# Patient Record
Sex: Male | Born: 1955 | Race: White | Hispanic: No | Marital: Single | Smoking: Current every day smoker
Health system: Southern US, Community
[De-identification: ages and names within clinical notes are randomized; demographics above are authoritative.]

## PROBLEM LIST (undated history)

## (undated) DIAGNOSIS — R931 Abnormal findings on diagnostic imaging of heart and coronary circulation: Secondary | ICD-10-CM

## (undated) DIAGNOSIS — F419 Anxiety disorder, unspecified: Secondary | ICD-10-CM

## (undated) DIAGNOSIS — I219 Acute myocardial infarction, unspecified: Secondary | ICD-10-CM

## (undated) DIAGNOSIS — N189 Chronic kidney disease, unspecified: Secondary | ICD-10-CM

## (undated) DIAGNOSIS — F32A Depression, unspecified: Secondary | ICD-10-CM

## (undated) DIAGNOSIS — C801 Malignant (primary) neoplasm, unspecified: Secondary | ICD-10-CM

## (undated) DIAGNOSIS — F329 Major depressive disorder, single episode, unspecified: Secondary | ICD-10-CM

## (undated) DIAGNOSIS — D759 Disease of blood and blood-forming organs, unspecified: Secondary | ICD-10-CM

## (undated) HISTORY — PX: CARDIAC CATHETERIZATION: SHX172

## (undated) HISTORY — PX: TONSILLECTOMY: SUR1361

## (undated) HISTORY — PX: CORONARY ANGIOPLASTY WITH STENT PLACEMENT: SHX49

## (undated) HISTORY — PX: BACK SURGERY: SHX140

## (undated) HISTORY — PX: WRIST SURGERY: SHX841

---

## 1997-12-25 ENCOUNTER — Ambulatory Visit (HOSPITAL_COMMUNITY): Admission: RE | Admit: 1997-12-25 | Discharge: 1997-12-25 | Payer: Self-pay | Admitting: Urology

## 2003-01-05 ENCOUNTER — Ambulatory Visit (HOSPITAL_COMMUNITY): Admission: RE | Admit: 2003-01-05 | Discharge: 2003-01-06 | Payer: Self-pay | Admitting: Neurosurgery

## 2006-05-10 ENCOUNTER — Ambulatory Visit (HOSPITAL_COMMUNITY): Admission: RE | Admit: 2006-05-10 | Discharge: 2006-05-10 | Payer: Self-pay | Admitting: Urology

## 2010-11-04 ENCOUNTER — Other Ambulatory Visit: Payer: Self-pay | Admitting: Neurosurgery

## 2010-11-04 DIAGNOSIS — S32009A Unspecified fracture of unspecified lumbar vertebra, initial encounter for closed fracture: Secondary | ICD-10-CM

## 2010-11-08 ENCOUNTER — Ambulatory Visit
Admission: RE | Admit: 2010-11-08 | Discharge: 2010-11-08 | Disposition: A | Discharge status: Home / Self Care | Payer: Medicare Other | Source: Ambulatory Visit | Attending: Neurosurgery | Admitting: Neurosurgery

## 2010-11-08 DIAGNOSIS — S32009A Unspecified fracture of unspecified lumbar vertebra, initial encounter for closed fracture: Secondary | ICD-10-CM

## 2010-11-08 MED ORDER — GADOBENATE DIMEGLUMINE 529 MG/ML IV SOLN
20.0000 mL | Freq: Once | INTRAVENOUS | Status: AC | PRN
  Administered 2010-11-08: 20 mL via INTRAVENOUS

## 2011-01-01 ENCOUNTER — Encounter (HOSPITAL_COMMUNITY): Payer: Self-pay | Admitting: Pharmacy Technician

## 2011-01-05 ENCOUNTER — Encounter (HOSPITAL_COMMUNITY)
Admission: RE | Admit: 2011-01-05 | Discharge: 2011-01-05 | Disposition: A | Discharge status: Home / Self Care | Payer: Medicare Other | Source: Ambulatory Visit | Attending: Neurosurgery | Admitting: Neurosurgery

## 2011-01-05 ENCOUNTER — Other Ambulatory Visit: Payer: Self-pay | Admitting: Neurosurgery

## 2011-01-05 ENCOUNTER — Encounter (HOSPITAL_COMMUNITY): Payer: Self-pay

## 2011-01-05 HISTORY — DX: Acute myocardial infarction, unspecified: I21.9

## 2011-01-05 HISTORY — DX: Abnormal findings on diagnostic imaging of heart and coronary circulation: R93.1

## 2011-01-05 HISTORY — DX: Anxiety disorder, unspecified: F41.9

## 2011-01-05 HISTORY — DX: Depression, unspecified: F32.A

## 2011-01-05 HISTORY — DX: Major depressive disorder, single episode, unspecified: F32.9

## 2011-01-05 LAB — CBC
HCT: 41.1 % (ref 39.0–52.0)
Hemoglobin: 13.8 g/dL (ref 13.0–17.0)
MCH: 31.2 pg (ref 26.0–34.0)
RBC: 4.43 MIL/uL (ref 4.22–5.81)

## 2011-01-05 LAB — BASIC METABOLIC PANEL
BUN: 11 mg/dL (ref 6–23)
CO2: 26 mEq/L (ref 19–32)
Glucose, Bld: 77 mg/dL (ref 70–99)
Potassium: 3.9 mEq/L (ref 3.5–5.1)
Sodium: 139 mEq/L (ref 135–145)

## 2011-01-05 LAB — SURGICAL PCR SCREEN
MRSA, PCR: POSITIVE — AB
Staphylococcus aureus: POSITIVE — AB

## 2011-01-05 LAB — TYPE AND SCREEN
ABO/RH(D): O POS
Antibody Screen: NEGATIVE

## 2011-01-05 NOTE — Progress Notes (Signed)
WILL GIVE TO P.A. FOR CARDIAC REVIEW.

## 2011-01-05 NOTE — Pre-Procedure Instructions (Signed)
20 Gabriel Adams  01/05/2011   Your procedure is scheduled on:  Thurs,Nov 29 @ 0730 Report to [facility] at 5:30 AM.  Call this number if you have problems the morning of surgery: [phone]   Remember:   Do not eat food:After Midnight.  Do not drink clear liquids: 4 Hours before arrival.  Take these medicines the morning of surgery with A SIP OF WATER: Wellbutrin,Prozac,Pain Pill(if needed)   Do not wear jewelry, make-up or nail polish.  Do not wear lotions, powders, or perfumes. You may wear deodorant.  Do not shave 48 hours prior to surgery.  Do not bring valuables to the hospital.  Contacts, dentures or bridgework may not be worn into surgery.  Leave suitcase in the car. After surgery it may be brought to your room.  For patients admitted to the hospital, checkout time is 11:00 AM the day of discharge.   Patients discharged the day of surgery will not be allowed to drive home.  Name and phone number of your driver:   Special Instructions: CHG Shower Use Special Wash: 1/2 bottle night before surgery and 1/2 bottle morning of surgery.   Please read over the following fact sheets that you were given: Pain Booklet, Coughing and Deep Breathing, Blood Transfusion Information, MRSA Information and Surgical Site Infection Prevention

## 2011-01-05 NOTE — Progress Notes (Signed)
[facility] CALLED FOR STRESS TEST,CATH REPORTS,EKG,CXR. aLSO DR Darleen Crocker OFFICE CALLED FOR RECENT CARDIAC INFO.

## 2011-01-06 NOTE — Consult Note (Signed)
Anesthesia:  55 year old male for lumbar fusion.  Hx + for CAD/MI, smoking, hyperlipidemia, and hypotension.  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  I was asked to review his cardiac hx.  His primary Cardiologist is Dr. Judithe Modest at [facility].  He has had prior stents to the LAD (DES 05/28/09) and CX.  Dr. Judithe Modest saw Gabriel Adams preoperatively and felt he should be acceptable risk to proceed.  His last stress test was 08/07/10 showing no evidence of ischemia, small fixed inferoapical defect c/w previous MI, EF 44%.  Echo from 07/03/10 showed mild LV dysfunction, mild hypokinesis of distal septum and apex, EF 60%, mild dilation of the aortic root and the ascending aorta, and mild LA enlargement.  These records plus his last cath from 05/28/09 are on the chart under the Cardiology tab.  His labs and his 07/11/10 EKG were reviewed.  He is scheduled to get a 2V CXR when he arrives on the day of surgery.  Plan to proceed if remains asymptomatic.

## 2011-01-14 MED ORDER — CEFAZOLIN SODIUM-DEXTROSE 2-3 GM-% IV SOLR
2.0000 g | INTRAVENOUS | Status: AC
  Administered 2011-01-15: 2 g via INTRAVENOUS
  Filled 2011-01-14: qty 50

## 2011-01-15 ENCOUNTER — Encounter (HOSPITAL_COMMUNITY): Payer: Self-pay | Admitting: Neurosurgery

## 2011-01-15 ENCOUNTER — Encounter (HOSPITAL_COMMUNITY)
Admission: RE | Disposition: A | Discharge status: Home Health | Payer: Self-pay | Source: Ambulatory Visit | Attending: Neurosurgery

## 2011-01-15 ENCOUNTER — Inpatient Hospital Stay (HOSPITAL_COMMUNITY): Payer: Medicare Other

## 2011-01-15 ENCOUNTER — Inpatient Hospital Stay (HOSPITAL_COMMUNITY): Payer: Medicare Other | Admitting: Vascular Surgery

## 2011-01-15 ENCOUNTER — Encounter (HOSPITAL_COMMUNITY): Payer: Self-pay | Admitting: Vascular Surgery

## 2011-01-15 ENCOUNTER — Inpatient Hospital Stay (HOSPITAL_COMMUNITY)
Admission: RE | Admit: 2011-01-15 | Discharge: 2011-01-19 | DRG: 460 | Disposition: A | Discharge status: Home Health | Payer: Medicare Other | Source: Ambulatory Visit | Attending: Neurosurgery | Admitting: Neurosurgery

## 2011-01-15 DIAGNOSIS — K5900 Constipation, unspecified: Secondary | ICD-10-CM | POA: Diagnosis not present

## 2011-01-15 DIAGNOSIS — Z981 Arthrodesis status: Secondary | ICD-10-CM

## 2011-01-15 DIAGNOSIS — R262 Difficulty in walking, not elsewhere classified: Secondary | ICD-10-CM

## 2011-01-15 DIAGNOSIS — Q762 Congenital spondylolisthesis: Principal | ICD-10-CM

## 2011-01-15 DIAGNOSIS — Z7982 Long term (current) use of aspirin: Secondary | ICD-10-CM

## 2011-01-15 DIAGNOSIS — Z79899 Other long term (current) drug therapy: Secondary | ICD-10-CM

## 2011-01-15 HISTORY — PX: ANTERIOR LAT LUMBAR FUSION: SHX1168

## 2011-01-15 HISTORY — DX: Disease of blood and blood-forming organs, unspecified: D75.9

## 2011-01-15 HISTORY — DX: Malignant (primary) neoplasm, unspecified: C80.1

## 2011-01-15 SURGERY — ANTERIOR LATERAL LUMBAR FUSION 1 LEVEL
Anesthesia: General | Site: Spine Lumbar | Laterality: Right | Wound class: Clean

## 2011-01-15 MED ORDER — KCL IN DEXTROSE-NACL 20-5-0.45 MEQ/L-%-% IV SOLN
INTRAVENOUS | Status: DC
  Administered 2011-01-15: 75 mL/h via INTRAVENOUS
  Administered 2011-01-16 – 2011-01-18 (×4): via INTRAVENOUS
  Filled 2011-01-15 (×7): qty 1000

## 2011-01-15 MED ORDER — ZOLPIDEM TARTRATE 10 MG PO TABS: 10.0000 mg | ORAL_TABLET | Freq: Every evening | ORAL | Status: DC | PRN

## 2011-01-15 MED ORDER — HYDROMORPHONE 0.3 MG/ML IV SOLN
INTRAVENOUS | Status: DC
  Administered 2011-01-15: 3 mL via INTRAVENOUS
  Administered 2011-01-15 (×2): 7.5 mg via INTRAVENOUS
  Administered 2011-01-16: 0.3 mg via INTRAVENOUS
  Administered 2011-01-16: 3.9 mg via INTRAVENOUS
  Administered 2011-01-16: 1.5 mg via INTRAVENOUS
  Administered 2011-01-16: 3.9 mg via INTRAVENOUS
  Administered 2011-01-16: 3.6 mg via INTRAVENOUS
  Administered 2011-01-16: 4.8 mg via INTRAVENOUS
  Administered 2011-01-17: 2.1 mg via INTRAVENOUS
  Administered 2011-01-17: 1.2 mg via INTRAVENOUS
  Administered 2011-01-17: 1.9 mg via INTRAVENOUS
  Administered 2011-01-17: 05:00:00 via INTRAVENOUS
  Administered 2011-01-17: 2.8 mg via INTRAVENOUS
  Administered 2011-01-17: 3.9 mg via INTRAVENOUS
  Administered 2011-01-18: 2.85 mg via INTRAVENOUS
  Administered 2011-01-18: 7.5 mg via INTRAVENOUS
  Administered 2011-01-18: 1.5 mg via INTRAVENOUS
  Filled 2011-01-15 (×5): qty 25

## 2011-01-15 MED ORDER — OXYCODONE HCL 5 MG PO TABS
30.0000 mg | ORAL_TABLET | ORAL | Status: DC | PRN
  Administered 2011-01-15 – 2011-01-19 (×13): 30 mg via ORAL
  Filled 2011-01-15 (×13): qty 6

## 2011-01-15 MED ORDER — ONDANSETRON HCL 4 MG/2ML IJ SOLN
INTRAMUSCULAR | Status: DC | PRN
  Administered 2011-01-15: 4 mg via INTRAVENOUS

## 2011-01-15 MED ORDER — ACETAMINOPHEN 650 MG RE SUPP: 650.0000 mg | RECTAL | Status: DC | PRN

## 2011-01-15 MED ORDER — ROCURONIUM BROMIDE 100 MG/10ML IV SOLN
INTRAVENOUS | Status: DC | PRN
  Administered 2011-01-15: 30 mg via INTRAVENOUS

## 2011-01-15 MED ORDER — DEXTROSE 5 % IV SOLN
INTRAVENOUS | Status: DC | PRN
  Administered 2011-01-15 (×2): via INTRAVENOUS

## 2011-01-15 MED ORDER — DIPHENHYDRAMINE HCL 12.5 MG/5ML PO ELIX: 12.5000 mg | ORAL_SOLUTION | Freq: Four times a day (QID) | ORAL | Status: DC | PRN

## 2011-01-15 MED ORDER — ONDANSETRON HCL 4 MG/2ML IJ SOLN: 4.0000 mg | Freq: Four times a day (QID) | INTRAMUSCULAR | Status: DC | PRN

## 2011-01-15 MED ORDER — HYDROMORPHONE HCL PF 1 MG/ML IJ SOLN: 0.5000 mg | INTRAMUSCULAR | Status: DC | PRN

## 2011-01-15 MED ORDER — HYDROMORPHONE HCL PF 1 MG/ML IJ SOLN
0.2500 mg | INTRAMUSCULAR | Status: DC | PRN
  Administered 2011-01-15 (×5): 0.5 mg via INTRAVENOUS

## 2011-01-15 MED ORDER — HYDROMORPHONE HCL PF 1 MG/ML IJ SOLN
0.5000 mg | INTRAMUSCULAR | Status: DC | PRN
  Administered 2011-01-15 – 2011-01-16 (×3): 1 mg via INTRAVENOUS
  Filled 2011-01-15 (×4): qty 1

## 2011-01-15 MED ORDER — PHENOL 1.4 % MT LIQD: 1.0000 | OROMUCOSAL | Status: DC | PRN

## 2011-01-15 MED ORDER — HYDROMORPHONE 0.3 MG/ML IV SOLN
INTRAVENOUS | Status: AC
  Administered 2011-01-16: 7.5 mg
  Filled 2011-01-15: qty 25

## 2011-01-15 MED ORDER — SODIUM CHLORIDE 0.9 % IJ SOLN: 3.0000 mL | INTRAMUSCULAR | Status: DC | PRN

## 2011-01-15 MED ORDER — ONDANSETRON HCL 4 MG/2ML IJ SOLN: 4.0000 mg | INTRAMUSCULAR | Status: DC | PRN

## 2011-01-15 MED ORDER — FLUOXETINE HCL 40 MG PO CAPS
40.0000 mg | ORAL_CAPSULE | Freq: Every day | ORAL | Status: DC
Start: 2011-01-15 — End: 2011-01-15

## 2011-01-15 MED ORDER — VECURONIUM BROMIDE 10 MG IV SOLR
INTRAVENOUS | Status: DC | PRN
  Administered 2011-01-15: 6 mg via INTRAVENOUS

## 2011-01-15 MED ORDER — DIPHENHYDRAMINE HCL 50 MG/ML IJ SOLN: 12.5000 mg | Freq: Four times a day (QID) | INTRAMUSCULAR | Status: DC | PRN

## 2011-01-15 MED ORDER — SODIUM CHLORIDE 0.9 % IV SOLN
20.0000 mg | INTRAVENOUS | Status: DC | PRN
  Administered 2011-01-15: 25 ug/min via INTRAVENOUS

## 2011-01-15 MED ORDER — HYDROCODONE-ACETAMINOPHEN 5-325 MG PO TABS
1.0000 | ORAL_TABLET | ORAL | Status: DC | PRN
  Administered 2011-01-17: 2 via ORAL
  Filled 2011-01-15 (×2): qty 2

## 2011-01-15 MED ORDER — PROPOFOL 10 MG/ML IV EMUL
INTRAVENOUS | Status: DC | PRN
  Administered 2011-01-15: 250 mg via INTRAVENOUS

## 2011-01-15 MED ORDER — GABAPENTIN 100 MG PO CAPS
200.0000 mg | ORAL_CAPSULE | Freq: Three times a day (TID) | ORAL | Status: DC
  Administered 2011-01-15 – 2011-01-19 (×12): 200 mg via ORAL
  Filled 2011-01-15 (×14): qty 2

## 2011-01-15 MED ORDER — ACETAMINOPHEN 10 MG/ML IV SOLN
INTRAVENOUS | Status: DC | PRN
  Administered 2011-01-15: 1000 mg via INTRAVENOUS

## 2011-01-15 MED ORDER — DIAZEPAM 5 MG PO TABS
5.0000 mg | ORAL_TABLET | Freq: Four times a day (QID) | ORAL | Status: DC | PRN
  Administered 2011-01-15 (×3): 5 mg via ORAL
  Administered 2011-01-16 – 2011-01-17 (×5): 10 mg via ORAL
  Filled 2011-01-15: qty 1
  Filled 2011-01-15 (×3): qty 2
  Filled 2011-01-15: qty 1
  Filled 2011-01-15 (×3): qty 2

## 2011-01-15 MED ORDER — SODIUM CHLORIDE 0.9 % IR SOLN
Status: DC | PRN
  Administered 2011-01-15 (×2)

## 2011-01-15 MED ORDER — DIAZEPAM 5 MG/ML IJ SOLN
5.0000 mg | Freq: Four times a day (QID) | INTRAMUSCULAR | Status: DC | PRN
  Filled 2011-01-15: qty 2

## 2011-01-15 MED ORDER — TAMSULOSIN HCL 0.4 MG PO CAPS
0.4000 mg | ORAL_CAPSULE | Freq: Every day | ORAL | Status: DC
  Administered 2011-01-15 – 2011-01-19 (×5): 0.4 mg via ORAL
  Filled 2011-01-15 (×6): qty 1

## 2011-01-15 MED ORDER — CEFAZOLIN SODIUM 1-5 GM-% IV SOLN
1.0000 g | Freq: Three times a day (TID) | INTRAVENOUS | Status: AC
  Administered 2011-01-15 – 2011-01-16 (×2): 1 g via INTRAVENOUS
  Filled 2011-01-15 (×3): qty 50

## 2011-01-15 MED ORDER — SODIUM CHLORIDE 0.9 % IJ SOLN
3.0000 mL | Freq: Two times a day (BID) | INTRAMUSCULAR | Status: DC
  Administered 2011-01-16 – 2011-01-18 (×5): 3 mL via INTRAVENOUS

## 2011-01-15 MED ORDER — SODIUM CHLORIDE 0.9 % IJ SOLN: 9.0000 mL | INTRAMUSCULAR | Status: DC | PRN

## 2011-01-15 MED ORDER — DOCUSATE SODIUM 100 MG PO CAPS
100.0000 mg | ORAL_CAPSULE | Freq: Two times a day (BID) | ORAL | Status: DC
  Administered 2011-01-16 – 2011-01-19 (×8): 100 mg via ORAL
  Filled 2011-01-15 (×8): qty 1

## 2011-01-15 MED ORDER — SODIUM CHLORIDE 0.9 % IV SOLN: 250.0000 mL | INTRAVENOUS | Status: DC

## 2011-01-15 MED ORDER — OXYCODONE-ACETAMINOPHEN 5-325 MG PO TABS
1.0000 | ORAL_TABLET | ORAL | Status: DC | PRN
  Administered 2011-01-16 – 2011-01-18 (×4): 2 via ORAL
  Filled 2011-01-15 (×4): qty 2

## 2011-01-15 MED ORDER — LACTATED RINGERS IV SOLN
INTRAVENOUS | Status: DC | PRN
  Administered 2011-01-15 (×3): via INTRAVENOUS

## 2011-01-15 MED ORDER — FLUOXETINE HCL 20 MG PO CAPS
40.0000 mg | ORAL_CAPSULE | Freq: Every day | ORAL | Status: DC
  Administered 2011-01-15 – 2011-01-19 (×5): 40 mg via ORAL
  Filled 2011-01-15 (×5): qty 2

## 2011-01-15 MED ORDER — LIDOCAINE-EPINEPHRINE 1 %-1:100000 IJ SOLN
INTRAMUSCULAR | Status: DC | PRN
  Administered 2011-01-15 (×2): 10 mL via INTRADERMAL

## 2011-01-15 MED ORDER — MENTHOL 3 MG MT LOZG: 1.0000 | LOZENGE | OROMUCOSAL | Status: DC | PRN

## 2011-01-15 MED ORDER — MUPIROCIN 2 % EX OINT
TOPICAL_OINTMENT | CUTANEOUS | Status: AC
  Administered 2011-01-15: 1 via NASAL
  Filled 2011-01-15: qty 22

## 2011-01-15 MED ORDER — LACTATED RINGERS IV SOLN
INTRAVENOUS | Status: DC | PRN
  Administered 2011-01-15: 08:00:00 via INTRAVENOUS

## 2011-01-15 MED ORDER — HEMOSTATIC AGENTS (NO CHARGE) OPTIME
TOPICAL | Status: DC | PRN
  Administered 2011-01-15 (×2): 1 via TOPICAL

## 2011-01-15 MED ORDER — FENTANYL CITRATE 0.05 MG/ML IJ SOLN
INTRAMUSCULAR | Status: DC | PRN
  Administered 2011-01-15: 100 ug via INTRAVENOUS
  Administered 2011-01-15: 50 ug via INTRAVENOUS
  Administered 2011-01-15 (×2): 100 ug via INTRAVENOUS
  Administered 2011-01-15: 50 ug via INTRAVENOUS
  Administered 2011-01-15: 100 ug via INTRAVENOUS

## 2011-01-15 MED ORDER — VANCOMYCIN HCL 1000 MG IV SOLR
1000.0000 mg | INTRAVENOUS | Status: DC | PRN
  Administered 2011-01-15: 1 g via INTRAVENOUS

## 2011-01-15 MED ORDER — BUPIVACAINE HCL (PF) 0.25 % IJ SOLN
INTRAMUSCULAR | Status: DC | PRN
  Administered 2011-01-15 (×2): 10 mL

## 2011-01-15 MED ORDER — NALOXONE HCL 0.4 MG/ML IJ SOLN: 0.4000 mg | INTRAMUSCULAR | Status: DC | PRN

## 2011-01-15 MED ORDER — ACETAMINOPHEN 325 MG PO TABS
650.0000 mg | ORAL_TABLET | ORAL | Status: DC | PRN
  Administered 2011-01-17: 650 mg via ORAL
  Filled 2011-01-15: qty 2

## 2011-01-15 MED ORDER — MIDAZOLAM HCL 5 MG/5ML IJ SOLN
INTRAMUSCULAR | Status: DC | PRN
  Administered 2011-01-15: 2 mg via INTRAVENOUS

## 2011-01-15 MED ORDER — SODIUM CHLORIDE 0.9 % IR SOLN
Status: DC | PRN
  Administered 2011-01-15 (×2): 1000 mL

## 2011-01-15 MED ORDER — THROMBIN 5000 UNITS EX KIT
PACK | CUTANEOUS | Status: DC | PRN
  Administered 2011-01-15 (×2): 5000 [IU] via TOPICAL

## 2011-01-15 MED ORDER — BUPROPION HCL ER (XL) 300 MG PO TB24
300.0000 mg | ORAL_TABLET | Freq: Every day | ORAL | Status: DC
  Administered 2011-01-15 – 2011-01-19 (×5): 300 mg via ORAL
  Filled 2011-01-15 (×5): qty 1

## 2011-01-15 SURGICAL SUPPLY — 80 items
BAG DECANTER FOR FLEXI CONT (MISCELLANEOUS) ×6 IMPLANT
BENZOIN TINCTURE PRP APPL 2/3 (GAUZE/BANDAGES/DRESSINGS) IMPLANT
BLADE SURG ROTATE 9660 (MISCELLANEOUS) IMPLANT
CLOTH BEACON ORANGE TIMEOUT ST (SAFETY) ×6 IMPLANT
CONT SPEC 4OZ CLIKSEAL STRL BL (MISCELLANEOUS) ×6 IMPLANT
COVER BACK TABLE 24X17X13 BIG (DRAPES) IMPLANT
COVER TABLE BACK 60X90 (DRAPES) ×6 IMPLANT
DERMABOND ADVANCED (GAUZE/BANDAGES/DRESSINGS) ×3
DERMABOND ADVANCED .7 DNX12 (GAUZE/BANDAGES/DRESSINGS) ×6 IMPLANT
DRAPE C-ARM 42X72 X-RAY (DRAPES) ×6 IMPLANT
DRAPE C-ARMOR (DRAPES) ×6 IMPLANT
DRAPE LAPAROTOMY 100X72X124 (DRAPES) ×6 IMPLANT
DRAPE POUCH INSTRU U-SHP 10X18 (DRAPES) ×6 IMPLANT
DRAPE SURG 17X23 STRL (DRAPES) ×6 IMPLANT
DRESSING TELFA 8X3 (GAUZE/BANDAGES/DRESSINGS) IMPLANT
DURAPREP 26ML APPLICATOR (WOUND CARE) ×6 IMPLANT
ELECT REM PT RETURN 9FT ADLT (ELECTROSURGICAL) ×6
ELECTRODE REM PT RTRN 9FT ADLT (ELECTROSURGICAL) ×4 IMPLANT
GAUZE SPONGE 4X4 16PLY XRAY LF (GAUZE/BANDAGES/DRESSINGS) ×3 IMPLANT
GLOVE BIO SURGEON STRL SZ7 (GLOVE) ×6 IMPLANT
GLOVE BIO SURGEON STRL SZ8 (GLOVE) ×9 IMPLANT
GLOVE BIOGEL PI IND STRL 7.0 (GLOVE) ×8 IMPLANT
GLOVE BIOGEL PI IND STRL 8 (GLOVE) ×4 IMPLANT
GLOVE BIOGEL PI IND STRL 8.5 (GLOVE) ×6 IMPLANT
GLOVE BIOGEL PI INDICATOR 7.0 (GLOVE) ×4
GLOVE BIOGEL PI INDICATOR 8 (GLOVE) ×2
GLOVE BIOGEL PI INDICATOR 8.5 (GLOVE) ×3
GLOVE ECLIPSE 7.5 STRL STRAW (GLOVE) ×9 IMPLANT
GLOVE EXAM NITRILE LRG STRL (GLOVE) IMPLANT
GLOVE EXAM NITRILE MD LF STRL (GLOVE) ×3 IMPLANT
GLOVE EXAM NITRILE XL STR (GLOVE) IMPLANT
GLOVE EXAM NITRILE XS STR PU (GLOVE) IMPLANT
GLOVE SURG SS PI 6.5 STRL IVOR (GLOVE) ×9 IMPLANT
GOWN BRE IMP SLV AUR LG STRL (GOWN DISPOSABLE) ×12 IMPLANT
GOWN BRE IMP SLV AUR XL STRL (GOWN DISPOSABLE) ×6 IMPLANT
GOWN STRL REIN 2XL LVL4 (GOWN DISPOSABLE) ×6 IMPLANT
IMPL COROENT XL 12X18X55 ×2 IMPLANT
IMPLANT COROENT XL 12X18X55 ×3 IMPLANT
KIT BASIN OR (CUSTOM PROCEDURE TRAY) ×6 IMPLANT
KIT DILATOR XLIF 5 (KITS) ×2 IMPLANT
KIT INFUSE SMALL (Orthopedic Implant) ×3 IMPLANT
KIT MAXCESS (KITS) ×3 IMPLANT
KIT NEEDLE NVM5 EMG ELECT (KITS) ×2 IMPLANT
KIT NEEDLE NVM5 EMG ELECTRODE (KITS) ×1
KIT POSITION SURG JACKSON T1 (MISCELLANEOUS) IMPLANT
KIT ROOM TURNOVER OR (KITS) ×3 IMPLANT
KIT XLIF (KITS) ×1
MARKER SKIN DUAL TIP RULER LAB (MISCELLANEOUS) IMPLANT
NEEDLE HYPO 25X1 1.5 SAFETY (NEEDLE) ×6 IMPLANT
NEEDLE TARGETING ILLICO (NEEDLE) ×3 IMPLANT
NS IRRIG 1000ML POUR BTL (IV SOLUTION) ×6 IMPLANT
PACK LAMINECTOMY NEURO (CUSTOM PROCEDURE TRAY) ×6 IMPLANT
PAD ARMBOARD 7.5X6 YLW CONV (MISCELLANEOUS) ×15 IMPLANT
PATTIES SURGICAL .5 X.5 (GAUZE/BANDAGES/DRESSINGS) IMPLANT
PATTIES SURGICAL .5 X1 (DISPOSABLE) IMPLANT
PATTIES SURGICAL 1X1 (DISPOSABLE) IMPLANT
ROD TI PRECONT ILLICO 5.5X4.5 (Rod) ×3 IMPLANT
ROD TI PRECONT ILLICO 5.5X5 (Rod) ×3 IMPLANT
SCREW CANN PA ILLICO 6.5X45 (Screw) ×6 IMPLANT
SCREW CANN PA ILLICO 6.5X50 (Screw) ×6 IMPLANT
SPONGE GAUZE 4X4 12PLY (GAUZE/BANDAGES/DRESSINGS) IMPLANT
SPONGE LAP 4X18 X RAY DECT (DISPOSABLE) IMPLANT
SPONGE SURGIFOAM ABS GEL SZ50 (HEMOSTASIS) ×6 IMPLANT
STAPLER SKIN PROX WIDE 3.9 (STAPLE) ×3 IMPLANT
STRIP CLOSURE SKIN 1/2X4 (GAUZE/BANDAGES/DRESSINGS) IMPLANT
STRIP NEXOSS 5CC (Neuro Prosthesis/Implant) ×3 IMPLANT
SUT VIC AB 0 CT1 18XCR BRD8 (SUTURE) ×2 IMPLANT
SUT VIC AB 0 CT1 8-18 (SUTURE) ×1
SUT VIC AB 1 CT1 18XBRD ANBCTR (SUTURE) ×6 IMPLANT
SUT VIC AB 1 CT1 8-18 (SUTURE) ×3
SUT VIC AB 2-0 CT1 18 (SUTURE) ×6 IMPLANT
SUT VIC AB 3-0 SH 8-18 (SUTURE) ×12 IMPLANT
SYR 20ML ECCENTRIC (SYRINGE) ×6 IMPLANT
SYR INSULIN 1ML 31GX6 SAFETY (SYRINGE) IMPLANT
TAPE CLOTH 3X10 TAN LF (GAUZE/BANDAGES/DRESSINGS) ×6 IMPLANT
TIP TROCAR NITINOL ILLICO 18 (INSTRUMENTS) ×12 IMPLANT
TOWEL OR 17X24 6PK STRL BLUE (TOWEL DISPOSABLE) ×6 IMPLANT
TOWEL OR 17X26 10 PK STRL BLUE (TOWEL DISPOSABLE) ×6 IMPLANT
TRAY FOLEY CATH 14FRSI W/METER (CATHETERS) ×3 IMPLANT
WATER STERILE IRR 1000ML POUR (IV SOLUTION) ×3 IMPLANT

## 2011-01-15 NOTE — Transfer of Care (Signed)
Immediate Anesthesia Transfer of Care Note  Patient: Gabriel Adams  Procedure(s) Performed:  ANTERIOR LATERAL LUMBAR FUSION 1 LEVEL - Right Lumbar Four-Five Anteriolateral fusion with percutaneous pedicle screws; LUMBAR PERCUTANEOUS PEDICLE SCREW 1 LEVEL - Lumbar four-five Percutaneous Pedicle Screw  Patient Location: PACU  Anesthesia Type: General  Level of Consciousness: awake, alert  and oriented  Airway & Oxygen Therapy: Patient Spontanous Breathing and Patient connected to nasal cannula oxygen  Post-op Assessment: Report given to PACU RN and Post -op Vital signs reviewed and stable  Post vital signs: Reviewed and stable  Complications: No apparent anesthesia complications

## 2011-01-15 NOTE — H&P (Signed)
  Date of Initial H&P: 12/31/2010  History reviewed, patient examined, no change in status, stable for surgery. 

## 2011-01-15 NOTE — OR Nursing (Signed)
Count for Part-1 by Orma Flaming, Esiah Bazinet RN was correct for needles,sponges.Physician notified.

## 2011-01-15 NOTE — Progress Notes (Signed)
Total 1.5 mg iv pca dilaudid used in pacu. Hx cleared

## 2011-01-15 NOTE — Anesthesia Preprocedure Evaluation (Addendum)
Anesthesia Evaluation  Patient identified by MRN, date of birth, ID band Patient awake    Reviewed: Allergy & Precautions, H&P , NPO status , Patient's Chart, lab work & pertinent test results  Airway Mallampati: II  Neck ROM: full    Dental  (+) Teeth Intact,    Pulmonary Current Smoker,  clear to auscultation        Cardiovascular Exercise Tolerance: Poor + CAD, + Past MI (M I  X 03 & 11  Denies chest discomfort in last few months  No NTG for 3 months  EF 45 %) and + Cardiac Stents (last Stent 2011 per pt.) Regular Normal    Neuro/Psych PSYCHIATRIC DISORDERS Anxiety Depression    GI/Hepatic negative GI ROS, Neg liver ROS,   Endo/Other  Negative Endocrine ROS  Renal/GU negative Renal ROS  Genitourinary negative   Musculoskeletal  (+) Arthritis - (in his back), Osteoarthritis,    Abdominal   Peds negative pediatric ROS (+)  Hematology negative hematology ROS (+)   Anesthesia Other Findings Cardiac clearance given by dr Darleen Crocker.  No ischemia on recent stress test.  EF 44%  Reproductive/Obstetrics negative OB ROS                         Anesthesia Physical Anesthesia Plan  ASA: III  Anesthesia Plan: General   Post-op Pain Management:    Induction: Intravenous  Airway Management Planned: Oral ETT  Additional Equipment:   Intra-op Plan:   Post-operative Plan: Extubation in OR  Informed Consent: I have reviewed the patients History and Physical, chart, labs and discussed the procedure including the risks, benefits and alternatives for the proposed anesthesia with the patient or authorized representative who has indicated his/her understanding and acceptance.   Dental advisory given  Plan Discussed with: CRNA and Surgeon  Anesthesia Plan Comments:        Anesthesia Quick Evaluation

## 2011-01-15 NOTE — OR Nursing (Signed)
Part 1 End Time-10:16.Start time Part 2-10:42

## 2011-01-15 NOTE — Anesthesia Postprocedure Evaluation (Signed)
Anesthesia Post Note  Patient: Gabriel Adams  Procedure(s) Performed:  ANTERIOR LATERAL LUMBAR FUSION 1 LEVEL - Right Lumbar Four-Five Anteriolateral fusion with percutaneous pedicle screws; LUMBAR PERCUTANEOUS PEDICLE SCREW 1 LEVEL - Lumbar four-five Percutaneous Pedicle Screw  Anesthesia type: General  Patient location: PACU  Post pain: Pain level controlled and Adequate analgesia  Post assessment: Post-op Vital signs reviewed, Patient's Cardiovascular Status Stable, Respiratory Function Stable, Patent Airway and Pain level controlled  Last Vitals:  Filed Vitals:   01/15/11 1200  BP: 125/79  Pulse: 75  Temp: 36.7 C  Resp: 17    Post vital signs: Reviewed and stable  Level of consciousness: awake, alert  and oriented  Complications: No apparent anesthesia complications

## 2011-01-15 NOTE — Progress Notes (Signed)
Patient awake, alert, conversant immediately postop.  Full strength bilateral lower extremities. C/o incisional pain.

## 2011-01-15 NOTE — Anesthesia Procedure Notes (Signed)
Procedure Name: Intubation Date/Time: 01/15/2011 7:49 AM Performed by: Tyrone Nine Pre-anesthesia Checklist: Patient identified, Emergency Drugs available, Suction available and Patient being monitored Patient Re-evaluated:Patient Re-evaluated prior to inductionOxygen Delivery Method: Circle System Utilized Preoxygenation: Pre-oxygenation with 100% oxygen Intubation Type: IV induction Ventilation: Mask ventilation without difficulty Laryngoscope Size: Mac and 3 Grade View: Grade II Tube type: Oral Number of attempts: 1 Airway Equipment and Method: stylet Placement Confirmation: ETT inserted through vocal cords under direct vision,  positive ETCO2 and breath sounds checked- equal and bilateral Secured at: 23 cm Tube secured with: Tape Dental Injury: Teeth and Oropharynx as per pre-operative assessment

## 2011-01-15 NOTE — Transfer of Care (Incomplete)
Immediate Anesthesia Transfer of Care Note  Patient: Gabriel Adams  Procedure(s) Performed:  ANTERIOR LATERAL LUMBAR FUSION 1 LEVEL - Right Lumbar Four-Five Anteriolateral fusion with percutaneous pedicle screws; LUMBAR PERCUTANEOUS PEDICLE SCREW 1 LEVEL; (RADIOFREQUENCY) ABLATION  Patient Location: {PLACES; ANE POST:19477::"PACU"}  Anesthesia Type: {PROCEDURES; ANE POST ANESTHESIA TYPE:19480}  Level of Consciousness: {FINDINGS; ANE POST LEVEL OF CONSCIOUSNESS:19484}  Airway & Oxygen Therapy: {Exam; oxygen device:30095}  Post-op Assessment: {ASSESSMENT;POST-OP MVHQIO:96295}  Post vital signs: {DESC; ANE POST MWUXLK:44010}  Complications: {FINDINGS; ANE POST COMPLICATIONS:19485}

## 2011-01-15 NOTE — Progress Notes (Signed)
Pt c/o tongue feels like he bit it.  It is discolored, a little swollen and looks like he did bite it. Dr Alcide Goodness here to see this and also aware of pt's cont c/o terrible pain despite meds. New ord for addit. Dilaudid.

## 2011-01-15 NOTE — Op Note (Signed)
01/15/2011  11:52 AM  PATIENT:  Gabriel Adams  55 y.o. male  PRE-OPERATIVE DIAGNOSIS:  spondylolisthesis  POST-OPERATIVE DIAGNOSIS:  Spondylolisthesis  PROCEDURE:  Procedure(s): ANTERIOR LATERAL LUMBAR FUSION 1 LEVEL LUMBAR PERCUTANEOUS PEDICLE SCREW 1 LEVEL  SURGEON:  Surgeon(s): Dorian Heckle, MD Clydene Fake  PHYSICIAN ASSISTANT:   ASSISTANTS: Poteat, RN   ANESTHESIA:   general  EBL:  Total I/O In: 3400 [I.V.:3400] Out: 600 [Urine:600]  BLOOD ADMINISTERED:none  DRAINS: none   LOCAL MEDICATIONS USED:  LIDOCAINE 20CC  SPECIMEN:  No Specimen  DISPOSITION OF SPECIMEN:  N/A  COUNTS:  YES  TOURNIQUET:  * No tourniquets in log *  DICTATION: Patient was brought to the operating room and placed in left lateral decubitus position after smooth induction of general endotracheal anesthesia. Orthogonal x-ray was obtained in AP and lateral planes centered over the L4-5 interspace after that his patient was taped to the bed. The L4-5 interspace was marked on the skin along with a posterior retroperitoneal finger dissection incision. After prepping and draping in the usual sterile fashion the skin and subcutaneous tissues were infiltrated with local lidocaine. Finger dissection was performed and certainly the L4-5 level incision was made at the probe was inserted to the psoas muscle in the retroperitoneal plane and electrical testing was performed which demonstrated the lumbar plexus behind the probe. Sequential dilators were placed and electrical testing was again performed. 150 millimeter self-retaining retractor was placed and again electrical testing was performed. The retractor was locked down into the shim was placed. After confirming the absence of neural elements a thorough discectomy was performed along with preparation of the endplates. Contralateral release was performed and after using sequential trials it was elected to use a 55 x 18 x 12 lordotic implant which was  packed with an NexOss and small BMP. Positioning of the implant was confirmed on AP and lateral fluoroscopy. The wounds were closed in a sequential fashion with Vicryl stitches. The patient was then positioned on the OR table in a prone position after dressing the wounds with Dermabond. Percutaneous pedicle screws were then placed. 50 x 6.5 mm screws were placed at L4 and 45 x 6.5 mm screws were placed at L5. A 45 mm rod was placed on the left and a 50 mm rod was placed on the right. All screws appeared to be well positioned and there is attention was confirmed on AP and lateral fluoroscopy as well as the rods. The wounds were then closed with 0, 20 and 3-0 Vicryl sutures and dressed with Dermabond. Counts were correct at the end of the case the patient was extubated in the operating room and taken to recovery in stable and satisfactory condition having tolerated his surgery well.   PLAN OF CARE: Admit to inpatient   PATIENT DISPOSITION:  PACU - hemodynamically stable.   Delay start of Pharmacological VTE agent (>24hrs) due to surgical blood loss or risk of bleeding:  YES

## 2011-01-15 NOTE — Preoperative (Signed)
Beta Blockers   Reason not to administer Beta Blockers:Not Applicable 

## 2011-01-16 ENCOUNTER — Encounter (HOSPITAL_COMMUNITY): Payer: Self-pay | Admitting: Neurosurgery

## 2011-01-16 MED ORDER — CHLORHEXIDINE GLUCONATE CLOTH 2 % EX PADS
6.0000 | MEDICATED_PAD | Freq: Every day | CUTANEOUS | Status: DC
  Administered 2011-01-16 – 2011-01-18 (×3): 6 via TOPICAL

## 2011-01-16 MED ORDER — MUPIROCIN 2 % EX OINT
1.0000 "application " | TOPICAL_OINTMENT | Freq: Two times a day (BID) | CUTANEOUS | Status: DC
  Administered 2011-01-16 – 2011-01-19 (×7): 1 via NASAL
  Filled 2011-01-16: qty 22

## 2011-01-16 MED ORDER — PREGABALIN 75 MG PO CAPS
75.0000 mg | ORAL_CAPSULE | Freq: Two times a day (BID) | ORAL | Status: DC
  Administered 2011-01-16 – 2011-01-19 (×7): 75 mg via ORAL
  Filled 2011-01-16 (×7): qty 1

## 2011-01-16 MED ORDER — HYDROMORPHONE 0.3 MG/ML IV SOLN
INTRAVENOUS | Status: AC
  Administered 2011-01-16: 7.5 mg
  Filled 2011-01-16: qty 25

## 2011-01-16 NOTE — Progress Notes (Signed)
Subjective: Patient reports States, "My legs don't hurt as much but my right leg feels numb, like before surgery."  Objective: Vital signs in last 24 hours: Temp:  [97.4 F (36.3 C)-98.4 F (36.9 C)] 98.3 F (36.8 C) (11/30 0600) Pulse Rate:  [61-91] 65  (11/30 0600) Resp:  [16-21] 20  (11/30 0656) BP: (102-170)/(60-111) 128/70 mmHg (11/30 0600) SpO2:  [92 %-100 %] 94 % (11/30 0656) Weight:  [105.8 kg (233 lb 4 oz)] 233 lb 4 oz (105.8 kg) (11/30 0600)  Intake/Output from previous day: 11/29 0701 - 11/30 0700 In: 3400 [I.V.:3400] Out: 4500 [Urine:4500] Intake/Output this shift:    Alert, conversant. Family member at bedside. Good strength BLE. Incisions without new drainage; no erythema; no swelling.    Lab Results: No results found for this basename: WBC:2,HGB:2,HCT:2,PLT:2 in the last 72 hours BMET No results found for this basename: NA:2,K:2,CL:2,CO2:2,GLUCOSE:2,BUN:2,CREATININE:2,CALCIUM:2 in the last 72 hours  Studies/Results: Dg Chest 2 View  01/15/2011  *RADIOLOGY REPORT*  Clinical Data: Preoperative chest radiograph for lumbar spinal fusion.  CHEST - 2 VIEW  Comparison: Thoracic spine radiographs performed 10/21/2010  Findings: The lungs are well-aerated and clear.  There is no evidence of focal opacification, pleural effusion or pneumothorax.  The heart is borderline normal in size; the mediastinal contour is within normal limits.  No acute osseous abnormalities are seen. The patient is status post vertebroplasty at the mid thoracic spine, with associated chronic compression deformity.  IMPRESSION: No acute cardiopulmonary process seen.  Original Report Authenticated By: Tonia Ghent, M.D.   Dg Lumbar Spine 2-3 Views  01/15/2011  *RADIOLOGY REPORT*  Clinical Data: L4-5 fusion.  LUMBAR SPINE - 2-3 VIEW  Comparison: 12/31/2010  Findings: AP lateral intraoperative spot images demonstrate changes of posterior fusion at L4-5.  No hardware or bony complicating feature.   IMPRESSION: L4-5 fusion.  Original Report Authenticated By: Cyndie Chime, M.D.   Dg C-arm Gt 120 Min  01/15/2011  CLINICAL DATA: XLIF L4-5 with Pedicle Screws   C-ARM GT 120 MIN  Fluoroscopy was utilized by the requesting physician.  No radiographic  interpretation.      Assessment/Plan: Reassured re:RLE paresthesias and lumbar pain. LSO to be brought in by pt's mother today. Progressing as expected.  LOS: 1 day  Mobilize in LSO with PT.    PoteatArlys John 01/16/2011, 8:04 AM     Again, encouraged patient.  Strength full on confrontational testing.  Pain better controlled.

## 2011-01-16 NOTE — Progress Notes (Signed)
CSW received consult and met with pt. For full assessment, please see pt's chart. CSW is not seeking SNF at this time, as PT/OT is recommending discharge home with home health services. CSW will continue to follow to assess discharge plan.   Dede Query, MSW, Theresia Majors [phone]

## 2011-01-16 NOTE — Progress Notes (Signed)
Order received.  Pt waiting for family to bring brace.  Will try again this PM. [facility] PT [phone]
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
## 2011-01-16 NOTE — Progress Notes (Signed)
OT Cancellation Note  Treatment cancelled today due to patient's refusal to participate: Patient just finished with PT and had gotten back into bed. Patient in ^ pain and requesting pain medication from RN- RN notified. Will check back as schedule allows.  Gabriel Adams    OTR/L Pager: [phone] 01/16/2011

## 2011-01-17 ENCOUNTER — Encounter (HOSPITAL_COMMUNITY): Payer: Self-pay

## 2011-01-17 MED ORDER — HYDROMORPHONE 0.3 MG/ML IV SOLN
INTRAVENOUS | Status: AC
  Administered 2011-01-17: 7.5 mg
  Administered 2011-01-18: 0.42 mg
  Filled 2011-01-17: qty 25

## 2011-01-17 MED ORDER — SENNOSIDES-DOCUSATE SODIUM 8.6-50 MG PO TABS
1.0000 | ORAL_TABLET | Freq: Once | ORAL | Status: AC
  Administered 2011-01-17: 1 via ORAL
  Filled 2011-01-17: qty 1
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
## 2011-01-17 NOTE — Plan of Care (Signed)
Problem: Phase I Progression Outcomes Goal: OOB as tolerated unless otherwise ordered Outcome: Completed/Met Date Met:  01/17/11 Tolerates well but is impulsive and unsafe to ambulate alone

## 2011-01-17 NOTE — Progress Notes (Signed)
Physical Therapy Treatment Patient Details Name: Gabriel Adams MRN: [id number] DOB: 27-May-1955 Today's Date: 01/17/2011  PT Assessment/Plan  PT - Assessment/Plan PT Plan: Discharge plan remains appropriate PT Frequency: Min 5X/week Follow Up Recommendations: Home health PT Equipment Recommended: None recommended by PT PT Goals  Acute Rehab PT Goals PT Goal: Rolling Supine to Right Side - Progress: Progressing toward goal PT Goal: Rolling Supine to Left Side - Progress: Not met PT Goal: Supine/Side to Sit - Progress: Progressing toward goal PT Goal: Sit to Supine/Side - Progress: Not met PT Goal: Ambulate - Progress: Progressing toward goal PT Goal: Up/Down Stairs - Progress: Not met  PT Treatment Co-treat with OT (See OT EVAL for OT details) Precautions/Restrictions  Precautions Precautions: Back Precaution Booklet Issued: No Precaution Comments: Back handout given Required Braces or Orthoses: Yes Spinal Brace: Lumbar corset;Applied in sitting position (pt needed cues/min assist to don correctly) Restrictions Weight Bearing Restrictions: No Mobility (including Balance) Bed Mobility Bed Mobility: Yes Rolling Right: 4: Min assist;With rail Rolling Right Details (indicate cue type and reason): min verbal/tactile cues for correct logroll techinque (bend knees and keep shoulders/hips/knees in alingment with rolling. Right Sidelying to Sit: 4: Min assist;With rails;HOB flat Right Sidelying to Sit Details (indicate cue type and reason): cues to bring legs off edge of bed and use arms to transition trunk into sitting position with verbal/tactile cues. cues to not twist back with siting up transiton. Sitting - Scoot to Edge of Bed: 5: Supervision Sitting - Scoot to [place] of Bed Details (indicate cue type and reason): cues for ant wt shifting and use of arms to scoot hips forward without twisting back. Transfers Transfers: Yes Sit to Stand: 4: Min assist;From bed;From toilet Sit to  Stand Details (indicate cue type and reason): mod cues for correct hand placement with standing up for saftey and for ant wt shifting to assist with standing  (despite mod cues pt attempting to pull up on RW to stand). Stand to Sit: 4: Min assist;To toilet;To chair/3-in-1 Stand to Sit Details: verbal/tactile cues to back all the way to the surface before sitting down. to reach back for rails/armrests for saftey (not to use RW) and to use arms to control descent with sitting down. Ambulation/Gait Ambulation/Gait: Yes Ambulation/Gait Assistance: 4: Min assist Ambulation/Gait Assistance Details (indicate cue type and reason): 31ft x2. cues for upright posture, to stay with RW and to increase bil step lenth. Ambulation Distance (Feet): 12 Feet (21ft x2) Assistive device: Rolling walker Gait Pattern: Step-to pattern;Decreased step length - right;Decreased step length - left;Trunk flexed;Decreased dorsiflexion - right;Shuffle;Decreased hip/knee flexion - right (pt demos short, shuffled steps. with cues pt able to progres) Gait velocity: decreased cadence, increased with cues for reciprocal steps Stairs: No  Posture/Postural Control Posture/Postural Control: No significant limitations Exercise    End of Session PT - End of Session Equipment Utilized During Treatment: Gait belt;Back brace Activity Tolerance: Patient tolerated treatment well Patient left: in chair;with call bell in reach Nurse Communication: Mobility status for transfers;Mobility status for ambulation General Behavior During Session: [facility] for tasks performed Cognition: [facility] for tasks performed  Sallyanne Kuster 01/17/2011, 12:46 PM  Sallyanne Kuster, PTA Office- [phone]

## 2011-01-17 NOTE — Progress Notes (Signed)
Filed Vitals:   01/17/11 0510 01/17/11 0600 01/17/11 0831 01/17/11 1104  BP:  128/82  139/76  Pulse:  80  88  Temp:  97.8 F (36.6 C)  98.9 F (37.2 C)  TempSrc:  Oral  Oral  Resp:  20 16 16   Height:      Weight: 106.1 kg (233 lb 14.5 oz)     SpO2:  98% 94% 92%     Patient resting fairly comfortably sitting up in a chair in his lumbar brace. However he continues to use a Dilaudid PCA I discussed with him whether transition him to oral analgesics today, however he feels that he is having significant pain in the back as well in the lower extremities and that he needs to continue on the PCA for now. We we evaluate this tomorrow.  He has been seen by physical therapy yesterday is waiting seeing him today. He did ambulate a short distance yesterday and is hoping to be up to walk more today.  His Foley has been discontinued and apparently his footing well.  The nurse reports that his incisions are clean and dry.   Plan: Continued postoperative care continue PT and OT consider changing PCA to oral analgesics tomorrow.

## 2011-01-18 MED ORDER — HYDROMORPHONE HCL 2 MG PO TABS
2.0000 mg | ORAL_TABLET | ORAL | Status: DC | PRN
  Administered 2011-01-19: 4 mg via ORAL
  Filled 2011-01-18: qty 1
  Filled 2011-01-18: qty 2

## 2011-01-18 MED ORDER — MAGNESIUM HYDROXIDE NICU ORAL SYRINGE 400 MG/5 ML
30.0000 mL | Freq: Every day | ORAL | Status: DC | PRN
  Filled 2011-01-18: qty 30

## 2011-01-18 NOTE — Progress Notes (Signed)
Attempted treatment, pt sleeping soundly and did not disturb. Will reattempt as schedule allows. Narda Amber, PT, MS, DPT

## 2011-01-18 NOTE — Progress Notes (Signed)
Filed Vitals:   01/18/11 0220 01/18/11 0351 01/18/11 0558 01/18/11 1050  BP: 107/72  123/67 123/82  Pulse: 77  80 84  Temp: 98 F (36.7 C)  98.2 F (36.8 C) 98.4 F (36.9 C)  TempSrc: Oral  Oral Oral  Resp: 18 18 20 18   Height:      Weight:      SpO2: 96% 96% 97% 98%     Patient has had limited mobility and ambulation. We have encouraged increasing ambulation and activity. I have explained to the patient we will change her from Dilaudid PCA to   Plan: We'll DC PCA and begin oral analgesics. I have ordered milk of magnesia for constipation. Have encouraged ambulation.

## 2011-01-18 NOTE — Progress Notes (Signed)
Patient complaining of ache in chest and bilateral arms.  Medicated with Oxy IR 30 mg and repositioned patient.  Vitals obtained.  Oxygen saturation is reduced; placed on oxygen at 3l/[place].  Oxygen saturation improved to 93%.  Rechecked on patient at 1825 with reduction of discomfort.  Incentive spirometer encouraged and getting up and ambulating to reduce risk of contracting pneumonia.

## 2011-01-19 DIAGNOSIS — R262 Difficulty in walking, not elsewhere classified: Secondary | ICD-10-CM

## 2011-01-19 MED ORDER — OXYCODONE HCL 30 MG PO TABS: 30.0000 mg | ORAL_TABLET | ORAL | Status: AC | PRN

## 2011-01-19 MED ORDER — DIAZEPAM 5 MG PO TABS: 5.0000 mg | ORAL_TABLET | Freq: Four times a day (QID) | ORAL | Status: AC | PRN

## 2011-01-19 MED ORDER — MAGNESIUM HYDROXIDE 400 MG/5ML PO SUSP
30.0000 mL | Freq: Every day | ORAL | Status: DC | PRN
  Administered 2011-01-19: 30 mL via ORAL
  Filled 2011-01-19: qty 30

## 2011-01-19 NOTE — Discharge Summary (Signed)
Physician Discharge Summary  Patient ID: TAISHAUN LEVELS MRN: [id number] DOB/AGE: November 18, 1955 55 y.o.  Admit date: 01/15/2011 Discharge date: 01/19/2011  Admission Diagnoses:  Discharge Diagnoses:  Active Problems:  * No active hospital problems. *    Discharged Condition: good  Hospital Course: Gradually mobilized after L4/5 decompression and fusion  Consults: none  Significant Diagnostic Studies: none  Treatments: surgery: L4/5 decompression and fusion  Discharge Exam: Blood pressure 111/69, pulse 104, temperature 98.3 F (36.8 C), temperature source Oral, resp. rate 20, height 6" (0.152 m), weight 106.1 kg (233 lb 14.5 oz), SpO2 94.00%. Neurologic: Grossly normal Incision/Wound:C/D/I Mild numbness right lateral thigh  Disposition:    Current Discharge Medication List    CONTINUE these medications which have NOT CHANGED   Details  aspirin EC 325 MG tablet Take 325 mg by mouth daily.      buPROPion (WELLBUTRIN XL) 300 MG 24 hr tablet Take 300 mg by mouth daily.      FLUoxetine (PROZAC) 40 MG capsule Take 40 mg by mouth daily.      gabapentin (NEURONTIN) 100 MG capsule Take 200 mg by mouth 3 (three) times daily.      oxycodone (ROXICODONE) 30 MG immediate release tablet Take 30 mg by mouth every 4 (four) hours as needed. For pain     Tamsulosin HCl (FLOMAX) 0.4 MG CAPS Take 0.4 mg by mouth daily.           Signed: Fawaz Borquez D 01/19/2011, 9:02 AM

## 2011-01-19 NOTE — Progress Notes (Signed)
Pt is ready for discharge and will be returing home with home health services. RNCM is aware of pt's discharge plan. CSW signing off as no further clinical social work needs identified.   Dede Query, MSW, Theresia Majors [phone]

## 2011-01-19 NOTE — Progress Notes (Signed)
Subjective: Patient reports improving Objective: Vital signs in last 24 hours: Temp:  [98.4 F (36.9 C)-100.2 F (37.9 C)] 98.4 F (36.9 C) (12/03 0221) Pulse Rate:  [76-85] 85  (12/03 0221) Resp:  [18-22] 20  (12/03 0221) BP: (109-158)/(69-90) 109/69 mmHg (12/03 0221) SpO2:  [89 %-98 %] 93 % (12/03 0221) FiO2 (%):  [96 %-99 %] 99 % (12/02 1358)  Intake/Output from previous day: 12/02 0701 - 12/03 0700 In: 290 [P.O.:290] Out: 500 [Urine:500] Intake/Output this shift: Total I/O In: 240 [P.O.:240] Out: -   Physical Exam: Full strength  Lab Results: No results found for this basename: WBC:2,HGB:2,HCT:2,PLT:2 in the last 72 hours BMET No results found for this basename: NA:2,K:2,CL:2,CO2:2,GLUCOSE:2,BUN:2,CREATININE:2,CALCIUM:2 in the last 72 hours  Studies/Results: No results found.  Assessment/Plan: D/C home    LOS: 4 days    Dorian Heckle, MD 01/19/2011, 6:29 AM

## 2011-01-19 NOTE — Progress Notes (Signed)
IV dc'd discharge instructions given to pt., and reviewed.  Waiting for ride.

## 2011-01-19 NOTE — Progress Notes (Signed)
Case manager spoke with patient. States he wants me to speak with his sister prior to arranging his home health. Gave me number for sister [phone]. Called and left voice message. Asked patient could he not make selection so we can get this arranged prior to his being discharged, he said no.

## 2011-01-22 NOTE — Progress Notes (Signed)
Valium given iv instead of po dose at 1235 pm on 01/15/2011 5mg  iv wasted in sink

## 2011-02-24 NOTE — Progress Notes (Signed)
Utilization review completed. Anette Guarneri, RN, BSN. 02/24/11

## 2011-12-09 ENCOUNTER — Other Ambulatory Visit (HOSPITAL_COMMUNITY): Payer: Self-pay | Admitting: Neurosurgery

## 2011-12-09 ENCOUNTER — Other Ambulatory Visit: Payer: Self-pay | Admitting: Neurosurgery

## 2011-12-09 DIAGNOSIS — M47816 Spondylosis without myelopathy or radiculopathy, lumbar region: Secondary | ICD-10-CM

## 2011-12-22 ENCOUNTER — Encounter (HOSPITAL_COMMUNITY): Payer: Self-pay | Admitting: Pharmacy Technician

## 2011-12-24 ENCOUNTER — Ambulatory Visit (HOSPITAL_COMMUNITY)
Admission: RE | Admit: 2011-12-24 | Discharge: 2011-12-24 | Disposition: A | Discharge status: Home / Self Care | Payer: Medicare Other | Source: Ambulatory Visit | Attending: Neurosurgery | Admitting: Neurosurgery

## 2011-12-24 DIAGNOSIS — M47816 Spondylosis without myelopathy or radiculopathy, lumbar region: Secondary | ICD-10-CM

## 2011-12-24 DIAGNOSIS — M47817 Spondylosis without myelopathy or radiculopathy, lumbosacral region: Secondary | ICD-10-CM | POA: Insufficient documentation

## 2011-12-24 DIAGNOSIS — Z981 Arthrodesis status: Secondary | ICD-10-CM | POA: Insufficient documentation

## 2011-12-24 MED ORDER — DIAZEPAM 5 MG PO TABS
ORAL_TABLET | ORAL | Status: AC
  Filled 2011-12-24: qty 2

## 2011-12-24 MED ORDER — OXYCODONE HCL 5 MG PO TABS
30.0000 mg | ORAL_TABLET | Freq: Once | ORAL | Status: AC
  Administered 2011-12-24: 30 mg via ORAL
  Filled 2011-12-24: qty 6

## 2011-12-24 MED ORDER — ONDANSETRON HCL 4 MG/2ML IJ SOLN: 4.0000 mg | Freq: Four times a day (QID) | INTRAMUSCULAR | Status: DC | PRN

## 2011-12-24 MED ORDER — DIAZEPAM 5 MG PO TABS
10.0000 mg | ORAL_TABLET | Freq: Once | ORAL | Status: AC
  Administered 2011-12-24: 10 mg via ORAL

## 2011-12-24 MED ORDER — IOHEXOL 180 MG/ML  SOLN
20.0000 mL | Freq: Once | INTRAMUSCULAR | Status: AC | PRN
  Administered 2011-12-24: 20 mL via INTRATHECAL

## 2011-12-24 MED ORDER — OXYCODONE HCL 5 MG PO TABS: 30.0000 mg | ORAL_TABLET | Freq: Once | ORAL | Status: DC

## 2011-12-24 NOTE — Procedures (Signed)
L1/2 Omnipaque 180

## 2014-01-22 ENCOUNTER — Other Ambulatory Visit: Payer: Self-pay | Admitting: Anesthesiology

## 2014-01-24 ENCOUNTER — Encounter (HOSPITAL_COMMUNITY)
Admission: RE | Admit: 2014-01-24 | Discharge: 2014-01-24 | Disposition: A | Discharge status: Home / Self Care | Payer: Medicare Other | Source: Ambulatory Visit | Attending: Anesthesiology | Admitting: Anesthesiology

## 2014-01-24 ENCOUNTER — Encounter (HOSPITAL_COMMUNITY): Payer: Self-pay

## 2014-01-24 DIAGNOSIS — G894 Chronic pain syndrome: Secondary | ICD-10-CM | POA: Diagnosis not present

## 2014-01-24 DIAGNOSIS — N289 Disorder of kidney and ureter, unspecified: Secondary | ICD-10-CM | POA: Diagnosis not present

## 2014-01-24 DIAGNOSIS — Z8547 Personal history of malignant neoplasm of testis: Secondary | ICD-10-CM | POA: Diagnosis not present

## 2014-01-24 DIAGNOSIS — M961 Postlaminectomy syndrome, not elsewhere classified: Secondary | ICD-10-CM | POA: Diagnosis not present

## 2014-01-24 DIAGNOSIS — F17210 Nicotine dependence, cigarettes, uncomplicated: Secondary | ICD-10-CM | POA: Diagnosis not present

## 2014-01-24 DIAGNOSIS — Z9861 Coronary angioplasty status: Secondary | ICD-10-CM | POA: Diagnosis not present

## 2014-01-24 DIAGNOSIS — M5116 Intervertebral disc disorders with radiculopathy, lumbar region: Secondary | ICD-10-CM | POA: Diagnosis not present

## 2014-01-24 DIAGNOSIS — F418 Other specified anxiety disorders: Secondary | ICD-10-CM | POA: Diagnosis not present

## 2014-01-24 DIAGNOSIS — N400 Enlarged prostate without lower urinary tract symptoms: Secondary | ICD-10-CM | POA: Diagnosis not present

## 2014-01-24 DIAGNOSIS — I252 Old myocardial infarction: Secondary | ICD-10-CM | POA: Diagnosis not present

## 2014-01-24 DIAGNOSIS — D759 Disease of blood and blood-forming organs, unspecified: Secondary | ICD-10-CM | POA: Diagnosis not present

## 2014-01-24 HISTORY — DX: Chronic kidney disease, unspecified: N18.9

## 2014-01-24 LAB — BASIC METABOLIC PANEL
Anion gap: 13 (ref 5–15)
BUN: 12 mg/dL (ref 6–23)
CALCIUM: 9.2 mg/dL (ref 8.4–10.5)
CO2: 22 mEq/L (ref 19–32)
CREATININE: 0.84 mg/dL (ref 0.50–1.35)
Chloride: 104 mEq/L (ref 96–112)
GFR calc Af Amer: >90 mL/min (ref >90)
GFR calc non Af Amer: >90 mL/min (ref >90)
GLUCOSE: 97 mg/dL (ref 70–99)
Potassium: 5.2 mEq/L (ref 3.7–5.3)
SODIUM: 139 meq/L (ref 137–147)

## 2014-01-24 LAB — CBC
HEMATOCRIT: 42.6 % (ref 39.0–52.0)
Hemoglobin: 14.5 g/dL (ref 13.0–17.0)
MCH: 31.8 pg (ref 26.0–34.0)
MCHC: 34 g/dL (ref 30.0–36.0)
MCV: 93.4 fL (ref 78.0–100.0)
Platelets: 213 10*3/uL (ref 150–400)
RBC: 4.56 MIL/uL (ref 4.22–5.81)
RDW: 13.5 % (ref 11.5–15.5)
WBC: 7.5 10*3/uL (ref 4.0–10.5)

## 2014-01-24 LAB — SURGICAL PCR SCREEN
MRSA, PCR: NEGATIVE
Staphylococcus aureus: NEGATIVE

## 2014-01-24 NOTE — Pre-Procedure Instructions (Signed)
Gabriel Adams  01/24/2014   Your procedure is scheduled on:  Friday January 26, 2014 at 1120 AM  Report to [facility] at [phone] AM.  Call this number if you have problems the morning of surgery: [phone]   Remember:   Do not eat food or drink liquids after midnight.   Take these medicines the morning of surgery with A SIP OF WATER: Cymbalta,Opana ER,and Oxycodone if needed for pain Aspirin and Cialis stopped 01/19/14 patient stated.  Do not wear jewelry.  Do not wear lotions, powders, or colognes. You may notwear deodorant.   Men may shave face and neck.  Do not bring valuables to the hospital.  [facility] is not responsible                  for any belongings or valuables.               Contacts, dentures or bridgework may not be worn into surgery.  Leave suitcase in the car. After surgery it may be brought to your room.  For patients admitted to the hospital, discharge time is determined by your                treatment team.               Patients discharged the day of surgery will not be allowed to drive  home.    Special Instructions: [facility] - Preparing for Surgery  Before surgery, you can play an important role.  Because skin is not sterile, your skin needs to be as free of germs as possible.  You can reduce the number of germs on you skin by washing with CHG (chlorahexidine gluconate) soap before surgery.  CHG is an antiseptic cleaner which kills germs and bonds with the skin to continue killing germs even after washing.  Please DO NOT use if you have an allergy to CHG or antibacterial soaps.  If your skin becomes reddened/irritated stop using the CHG and inform your nurse when you arrive at Short Stay.  Do not shave (including legs and underarms) for at least 48 hours prior to the first CHG shower.  You may shave your face.  Please follow these instructions carefully:   1.  Shower with CHG Soap the night before surgery and the                                 morning of Surgery.  2.  If you choose to wash your hair, wash your hair first as usual with your       normal shampoo.  3.  After you shampoo, rinse your hair and body thoroughly to remove the                      Shampoo.  4.  Use CHG as you would any other liquid soap.  You can apply chg directly       to the skin and wash gently with scrungie or a clean washcloth.  5.  Apply the CHG Soap to your body ONLY FROM THE NECK DOWN.        Do not use on open wounds or open sores.  Avoid contact with your eyes,       ears, mouth and genitals (private parts).  Wash genitals (private parts)       with your normal soap.  6.  Wash thoroughly, paying special attention to the area where your surgery        will be performed.  7.  Thoroughly rinse your body with warm water from the neck down.  8.  DO NOT shower/wash with your normal soap after using and rinsing off       the CHG Soap.  9.  Pat yourself dry with a clean towel.            10.  Wear clean pajamas.            11.  Place clean sheets on your bed the night of your first shower and do not        sleep with pets.  Day of Surgery  Do not apply any lotions/deoderants the morning of surgery.  Please wear clean clothes to the hospital/surgery center.      Please read over the following fact sheets that you were given: Pain Booklet, Coughing and Deep Breathing, MRSA Information and Surgical Site Infection Prevention

## 2014-01-25 ENCOUNTER — Encounter (HOSPITAL_COMMUNITY): Payer: Self-pay

## 2014-01-25 ENCOUNTER — Encounter (HOSPITAL_COMMUNITY): Payer: Self-pay | Admitting: Anesthesiology

## 2014-01-25 MED ORDER — CEFAZOLIN SODIUM-DEXTROSE 2-3 GM-% IV SOLR
2.0000 g | INTRAVENOUS | Status: AC
  Administered 2014-01-26: 2 g via INTRAVENOUS
  Filled 2014-01-25: qty 50

## 2014-01-25 NOTE — Anesthesia Preprocedure Evaluation (Addendum)
Anesthesia Evaluation  Patient identified by MRN, date of birth, ID band Patient awake    Reviewed: Allergy & Precautions, H&P , NPO status , Patient's Chart, lab work & pertinent test results, reviewed documented beta blocker date and time   History of Anesthesia Complications (+) AWARENESS UNDER ANESTHESIA  Airway Mallampati: II   Neck ROM: Full    Dental  (+) Edentulous Upper, Edentulous Lower   Pulmonary Current Smoker,  breath sounds clear to auscultation        Cardiovascular + Past MI Rhythm:Regular  Cardiac Clearance for procedure, STENTS, ASA OK,   Neuro/Psych Anxiety Depression    GI/Hepatic negative GI ROS, Neg liver ROS,   Endo/Other    Renal/GU      Musculoskeletal   Abdominal (+)  Abdomen: soft.    Peds  Hematology   Anesthesia Other Findings   Reproductive/Obstetrics                            Anesthesia Physical Anesthesia Plan  ASA: III  Anesthesia Plan: MAC   Post-op Pain Management:    Induction: Intravenous  Airway Management Planned: Nasal Cannula  Additional Equipment:   Intra-op Plan:   Post-operative Plan:   Informed Consent: I have reviewed the patients History and Physical, chart, labs and discussed the procedure including the risks, benefits and alternatives for the proposed anesthesia with the patient or authorized representative who has indicated his/her understanding and acceptance.     Plan Discussed with:   Anesthesia Plan Comments:         Anesthesia Quick Evaluation

## 2014-01-25 NOTE — Progress Notes (Signed)
Anesthesia Chart Review:  Patient is a 58 year old male scheduled for lumbar spinal cord stimulator insertion on 01/26/14 by Dr. Maryjean Ka.  Case is booked as MAC anesthesia.  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 Sabas Sous; [facility]). PCP as of 2014 was Dr. Imagene Riches.   His primary Cardiologist is Dr. Otho Perl at [facility]), last visit 01/04/14.Patient was felt stable from a CAD standpoint. By notes, it appeared he discussed stopping Brilinta (> 1 year out from DES) at that time but patient was reluctant to stop. Patient reported that Brilinta is now on hold for surgery.  I spoke with Rosa at Dr. Donell Sievert office who stated that patient was okay to stay on ASA from a surgical standpoint.    Meds: Cymbalta, Flomax, ASA 325 mg, Lipitor, oxycodone, Opana ER, Cialis, Brilinta.  EKG on 01/24/14: NSR, poor r wave progression, LAD. I think it appears stable when compared to 10/27/12 EKG [facility]).  Cardiac cath on 11/03/12: Multivessel CAD. Severe restenosis of the OM1 (90% mid, 75% ostial) . Otherwise diffuse, mild non-obstructive CAD (20% proximal and mid LAD, 30% distal LAD followed by 80% distal LAD with poor run off. 10% proximal CX, 20% LPDA, 30% proximal RCA). Mild anterior segmental LV systolic dysfunction. LVEF 50-55%. Successful PCI/Xience DES of the mid and ostial OM1. Dual anti-platelet therapy for at least 12 months and CAD risk factor reduction.   Echo from 07/03/10 showed mild LV dysfunction, mild hypokinesis of distal septum and apex, EF 60%, mild dilation of the aortic root and the ascending aorta, and mild LA enlargement.   CT abd/pelvis 03/30/13 (Care Everywhere): Impression:   1.Stable right  middle lobe 0.6 cm pulmonary nodule, attention on followup.Centrilobular emphysematous changes.  2.Overall stable right hilar and mediastinal lymph nodes. No new orenlarging lymphadenopathy within the chest, abdomen, or pelvis.  3.No significant change in loss of height of the T8 vertebral body withvertebroplasty changes, nor in hardware within the lumbosacral spine.    Preoperative labs noted.   Patient had cardiology follow-up within the past month and was stable at that time.  His last cath with PCI was ~ 15 months ago. Labs are WNL.  If no acute changes then I anticipate that he can proceed as planned.  George Hugh [facility] Short Stay Center/Anesthesiology Phone [phone] 01/25/2014 10:42 AM
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
## 2014-01-26 ENCOUNTER — Ambulatory Visit (HOSPITAL_COMMUNITY): Payer: Medicare Other | Admitting: Vascular Surgery

## 2014-01-26 ENCOUNTER — Ambulatory Visit (HOSPITAL_COMMUNITY)
Admission: RE | Admit: 2014-01-26 | Discharge: 2014-01-26 | Disposition: A | Discharge status: Home / Self Care | Payer: Medicare Other | Source: Ambulatory Visit | Attending: Anesthesiology | Admitting: Anesthesiology

## 2014-01-26 ENCOUNTER — Encounter (HOSPITAL_COMMUNITY)
Admission: RE | Disposition: A | Discharge status: Home / Self Care | Payer: Self-pay | Source: Ambulatory Visit | Attending: Anesthesiology

## 2014-01-26 ENCOUNTER — Encounter (HOSPITAL_COMMUNITY): Payer: Self-pay | Admitting: *Deleted

## 2014-01-26 ENCOUNTER — Ambulatory Visit (HOSPITAL_COMMUNITY): Payer: Medicare Other | Admitting: Anesthesiology

## 2014-01-26 ENCOUNTER — Ambulatory Visit (HOSPITAL_COMMUNITY): Payer: Medicare Other

## 2014-01-26 DIAGNOSIS — D759 Disease of blood and blood-forming organs, unspecified: Secondary | ICD-10-CM | POA: Insufficient documentation

## 2014-01-26 DIAGNOSIS — Z9861 Coronary angioplasty status: Secondary | ICD-10-CM | POA: Insufficient documentation

## 2014-01-26 DIAGNOSIS — M549 Dorsalgia, unspecified: Secondary | ICD-10-CM

## 2014-01-26 DIAGNOSIS — M961 Postlaminectomy syndrome, not elsewhere classified: Secondary | ICD-10-CM | POA: Insufficient documentation

## 2014-01-26 DIAGNOSIS — M5116 Intervertebral disc disorders with radiculopathy, lumbar region: Secondary | ICD-10-CM | POA: Insufficient documentation

## 2014-01-26 DIAGNOSIS — N400 Enlarged prostate without lower urinary tract symptoms: Secondary | ICD-10-CM | POA: Insufficient documentation

## 2014-01-26 DIAGNOSIS — G894 Chronic pain syndrome: Secondary | ICD-10-CM | POA: Diagnosis not present

## 2014-01-26 DIAGNOSIS — I252 Old myocardial infarction: Secondary | ICD-10-CM | POA: Diagnosis not present

## 2014-01-26 DIAGNOSIS — F418 Other specified anxiety disorders: Secondary | ICD-10-CM | POA: Insufficient documentation

## 2014-01-26 DIAGNOSIS — N289 Disorder of kidney and ureter, unspecified: Secondary | ICD-10-CM | POA: Insufficient documentation

## 2014-01-26 DIAGNOSIS — Z8547 Personal history of malignant neoplasm of testis: Secondary | ICD-10-CM | POA: Insufficient documentation

## 2014-01-26 DIAGNOSIS — F17210 Nicotine dependence, cigarettes, uncomplicated: Secondary | ICD-10-CM | POA: Insufficient documentation

## 2014-01-26 HISTORY — PX: SPINAL CORD STIMULATOR INSERTION: SHX5378

## 2014-01-26 SURGERY — INSERTION, SPINAL CORD STIMULATOR, LUMBAR
Anesthesia: Monitor Anesthesia Care

## 2014-01-26 MED ORDER — PROMETHAZINE HCL 25 MG/ML IJ SOLN: 6.2500 mg | INTRAMUSCULAR | Status: DC | PRN

## 2014-01-26 MED ORDER — PROPOFOL INFUSION 10 MG/ML OPTIME
INTRAVENOUS | Status: DC | PRN
  Administered 2014-01-26: 100 ug/kg/min via INTRAVENOUS

## 2014-01-26 MED ORDER — MEPERIDINE HCL 25 MG/ML IJ SOLN: 6.2500 mg | INTRAMUSCULAR | Status: DC | PRN

## 2014-01-26 MED ORDER — LACTATED RINGERS IV SOLN
INTRAVENOUS | Status: DC | PRN
  Administered 2014-01-26: 13:00:00 via INTRAVENOUS

## 2014-01-26 MED ORDER — 0.9 % SODIUM CHLORIDE (POUR BTL) OPTIME
TOPICAL | Status: DC | PRN
  Administered 2014-01-26: 1000 mL

## 2014-01-26 MED ORDER — OXYCODONE HCL 15 MG PO TABS: 15.0000 mg | ORAL_TABLET | ORAL | Status: AC | PRN

## 2014-01-26 MED ORDER — CEPHALEXIN 500 MG PO CAPS: 500.0000 mg | ORAL_CAPSULE | Freq: Three times a day (TID) | ORAL | Status: DC

## 2014-01-26 MED ORDER — FENTANYL CITRATE 0.05 MG/ML IJ SOLN
INTRAMUSCULAR | Status: DC | PRN
  Administered 2014-01-26 (×3): 50 ug via INTRAVENOUS

## 2014-01-26 MED ORDER — SODIUM CHLORIDE 0.9 % IR SOLN
Status: DC | PRN
  Administered 2014-01-26: 500 mL

## 2014-01-26 MED ORDER — LACTATED RINGERS IV SOLN
INTRAVENOUS | Status: DC
  Administered 2014-01-26: 10:00:00 via INTRAVENOUS

## 2014-01-26 MED ORDER — FENTANYL CITRATE 0.05 MG/ML IJ SOLN
INTRAMUSCULAR | Status: AC
  Filled 2014-01-26: qty 5

## 2014-01-26 MED ORDER — FENTANYL CITRATE 0.05 MG/ML IJ SOLN
25.0000 ug | INTRAMUSCULAR | Status: DC | PRN
  Administered 2014-01-26: 50 ug via INTRAVENOUS
  Administered 2014-01-26: 25 ug via INTRAVENOUS

## 2014-01-26 MED ORDER — BUPIVACAINE-EPINEPHRINE (PF) 0.5% -1:200000 IJ SOLN
INTRAMUSCULAR | Status: DC | PRN
  Administered 2014-01-26: 21 mL via PERINEURAL

## 2014-01-26 MED ORDER — MIDAZOLAM HCL 2 MG/2ML IJ SOLN
INTRAMUSCULAR | Status: AC
  Filled 2014-01-26: qty 2

## 2014-01-26 MED ORDER — FENTANYL CITRATE 0.05 MG/ML IJ SOLN
INTRAMUSCULAR | Status: AC
  Filled 2014-01-26: qty 2

## 2014-01-26 MED ORDER — BACITRACIN ZINC 500 UNIT/GM EX OINT
TOPICAL_OINTMENT | CUTANEOUS | Status: DC | PRN
  Administered 2014-01-26: 1 via TOPICAL

## 2014-01-26 MED ORDER — MIDAZOLAM HCL 5 MG/5ML IJ SOLN
INTRAMUSCULAR | Status: DC | PRN
  Administered 2014-01-26 (×2): 1 mg via INTRAVENOUS

## 2014-01-26 SURGICAL SUPPLY — 65 items
ADHESIVE MEDICAL (Stimulator) ×2 IMPLANT
ANCHOR CLICK (Anchor) ×1 IMPLANT
ANCHOR CLIK NEURO F/LEAD 2 (Anchor) ×1 IMPLANT
BAG DECANTER FOR FLEXI CONT (MISCELLANEOUS) ×2 IMPLANT
BENZOIN TINCTURE PRP APPL 2/3 (GAUZE/BANDAGES/DRESSINGS) IMPLANT
BINDER ABDOMINAL 12 ML 46-62 (SOFTGOODS) ×2 IMPLANT
BLADE CLIPPER SURG (BLADE) IMPLANT
CHLORAPREP W/TINT 26ML (MISCELLANEOUS) ×2 IMPLANT
CONT SPEC 4OZ CLIKSEAL STRL BL (MISCELLANEOUS) ×2 IMPLANT
DRAPE C-ARM 42X72 X-RAY (DRAPES) ×2 IMPLANT
DRAPE C-ARMOR (DRAPES) ×2 IMPLANT
DRAPE LAPAROTOMY 100X72X124 (DRAPES) ×2 IMPLANT
DRAPE POUCH INSTRU U-SHP 10X18 (DRAPES) ×2 IMPLANT
DRAPE SURG 17X23 STRL (DRAPES) ×2 IMPLANT
DRSG OPSITE POSTOP 3X4 (GAUZE/BANDAGES/DRESSINGS) ×2 IMPLANT
DRSG OPSITE POSTOP 4X6 (GAUZE/BANDAGES/DRESSINGS) ×2 IMPLANT
DRSG TELFA 3X8 NADH (GAUZE/BANDAGES/DRESSINGS) IMPLANT
ELECT REM PT RETURN 9FT ADLT (ELECTROSURGICAL) ×2
ELECTRODE REM PT RTRN 9FT ADLT (ELECTROSURGICAL) ×1 IMPLANT
GAUZE SPONGE 4X4 16PLY XRAY LF (GAUZE/BANDAGES/DRESSINGS) IMPLANT
GLOVE BIOGEL PI IND STRL 7.5 (GLOVE) ×2 IMPLANT
GLOVE BIOGEL PI IND STRL 8 (GLOVE) ×1 IMPLANT
GLOVE BIOGEL PI INDICATOR 7.5 (GLOVE) ×2
GLOVE BIOGEL PI INDICATOR 8 (GLOVE) ×1
GLOVE ECLIPSE 7.5 STRL STRAW (GLOVE) ×2 IMPLANT
GLOVE EXAM NITRILE LRG STRL (GLOVE) IMPLANT
GLOVE EXAM NITRILE MD LF STRL (GLOVE) IMPLANT
GLOVE EXAM NITRILE XL STR (GLOVE) IMPLANT
GLOVE EXAM NITRILE XS STR PU (GLOVE) IMPLANT
GLOVE SURG SS PI 7.5 STRL IVOR (GLOVE) ×4 IMPLANT
GOWN STRL REUS W/ TWL LRG LVL3 (GOWN DISPOSABLE) ×2 IMPLANT
GOWN STRL REUS W/ TWL XL LVL3 (GOWN DISPOSABLE) IMPLANT
GOWN STRL REUS W/TWL 2XL LVL3 (GOWN DISPOSABLE) IMPLANT
GOWN STRL REUS W/TWL LRG LVL3 (GOWN DISPOSABLE) ×2
GOWN STRL REUS W/TWL XL LVL3 (GOWN DISPOSABLE)
IPG PRECISION SPECTRA (Stimulator) ×2 IMPLANT
KIT BASIN OR (CUSTOM PROCEDURE TRAY) ×2 IMPLANT
KIT CHARGING (KITS) ×1
KIT CHARGING PRECISION NEURO (KITS) ×1 IMPLANT
KIT REMOTE CONTROL PRECISION (KITS) ×2 IMPLANT
KIT ROOM TURNOVER OR (KITS) ×2 IMPLANT
KIT SPLITTER 30CM 2X8 (Stimulator) ×4 IMPLANT
LEAD KIT CONTACT INFINION 16 (Stimulator) ×4 IMPLANT
LIQUID BAND (GAUZE/BANDAGES/DRESSINGS) IMPLANT
NEEDLE 18GX1X1/2 (RX/OR ONLY) (NEEDLE) IMPLANT
NEEDLE HYPO 25X1 1.5 SAFETY (NEEDLE) ×2 IMPLANT
NS IRRIG 1000ML POUR BTL (IV SOLUTION) ×2 IMPLANT
PACK LAMINECTOMY NEURO (CUSTOM PROCEDURE TRAY) ×2 IMPLANT
PAD ARMBOARD 7.5X6 YLW CONV (MISCELLANEOUS) ×2 IMPLANT
SPONGE LAP 4X18 X RAY DECT (DISPOSABLE) IMPLANT
SPONGE SURGIFOAM ABS GEL SZ50 (HEMOSTASIS) IMPLANT
STAPLER SKIN PROX WIDE 3.9 (STAPLE) ×2 IMPLANT
STRIP CLOSURE SKIN 1/2X4 (GAUZE/BANDAGES/DRESSINGS) IMPLANT
SUT MNCRL AB 4-0 PS2 18 (SUTURE) IMPLANT
SUT SILK 0 (SUTURE) ×1
SUT SILK 0 MO-6 18XCR BRD 8 (SUTURE) ×1 IMPLANT
SUT SILK 0 TIES 10X30 (SUTURE) IMPLANT
SUT SILK 2 0 TIES 10X30 (SUTURE) IMPLANT
SUT VIC AB 2-0 CP2 18 (SUTURE) ×6 IMPLANT
SYR EPIDURAL 5ML GLASS (SYRINGE) ×2 IMPLANT
SYRINGE 10CC LL (SYRINGE) IMPLANT
TOWEL OR 17X24 6PK STRL BLUE (TOWEL DISPOSABLE) ×2 IMPLANT
TOWEL OR 17X26 10 PK STRL BLUE (TOWEL DISPOSABLE) ×2 IMPLANT
WATER STERILE IRR 1000ML POUR (IV SOLUTION) ×2 IMPLANT
YANKAUER SUCT BULB TIP NO VENT (SUCTIONS) ×2 IMPLANT

## 2014-01-26 NOTE — Op Note (Signed)
PREOP DX: 1) lumbago  2) lumbar radiculopathy  3) lumbar post-laminectomy syndrome  4) chronic pain  POSTOP DX: 1) lumbago  2) lumbar radiculopathy  3) lumbar post-laminectomy syndrome  4) chronic pain  PROCEDURES PERFORMED:1) intraop fluoro 2) placement of 2 16 contact [employer] Infinion leads 3) placement of Spectra SCS generator  SURGEON:Asusena Sigley  ASSISTANT: NONE  ANESTHESIA: MAC  EBL: <20cc  DESCRIPTION OF PROCEDURE: After a discussion of risks, benefits and alternatives, informed consent was obtained. The patient was taken to the OR, turned prone onto a Jackson table, all pressure points padded, SCD's placed, and an adequate plane of anesthesia induced. A timeout was taken to verify the correct patient, position, personnel, availability of appropriate equipment, and administration of perioperative antibiotics.  The thoracic and lumbar areas were widely prepped with chloraprep and draped into a sterile field. Fluoroscopy was used to plan a left paramedian incision at the L1-L3 levels, and an incision made with a 10 blade and carried down to the dorsolumbar fascia with the bovie and blunt dissection. Retractors were placed and a 14g [employer] tuohy needle placed into the epidural space at the L1-2 interspace using biplanar fluoro and loss-of-resistance technique. The needle was aspirated without any return of fluid. A [employer] INFINION lead was introduced and under live AP fluoro advanced until the distal-most contact overlay the caudad aspect of the T7 vertebral body shadow with the rest of the contacts distributed over the T8 and T9 vertebral bodies in a position just right of anatomic midline. A second Infinion lead was placed just left of anatomic midline in the same levels using the same technique. The patient was awakened and the leads tested; impedances were good, and the patient reported good coverage with amplitudes in the 3-7 mA range. 0 silk sutures  were placed in the fascia adjacent to the needles. The needles and stylets were removed under fluoroscopy with no lead migration noted. Leads were then fixed to the fascia with Clik anchors; repeat images were obtained to verify that there had been no lead migration.  The incision was inspected and hemostasis obtained with the bipolar cautery.  Attention was then turned to creation of a subcutaneous pocket. At the left flank, a 3 cm incision was made with a 10 blade and using the bovie and blunt dissection a pocket of size appropriate to place a SCS generator. The pocket was trialed, and found to be of adequate size. The pocket was inspected for hemostasis, which was found to be excellent. Using reverse seldinger technique, the leads were tunneled to the pocket site, and the leads inserted into the SCS generator. Impedances were checked, and all found to be excellent. The leads were then all fixed into position with a self-torquing wrench. The wiring was all carefully coiled, placed behind the generator and placed in the pocket.  Both incisions were copiously irrigated with bacitracin-containing irrigation. The lumbar incision was closed in 2 deep layers of interrupted 2-0 vicryl and the skin closed with staples. The pocket incision was closed with a deeper layer of 2-0 vicryl interrupted sutures, and the skin closed with staples. Antibiotic ointment and sterile dressings were applied. Needle, sponge, and instrument counts were correct x2 at the end of the case.  The patient was then carefully awakened from anesthesia, turned supine, an abdominal binder placed, and the patient taken to the recovery room where she underwent complex spinal cord stimulator programming.  COMPLICATIONS: NONE  CONDITION: Stable throughout the course of the procedure  and immediately afterward  DISPOSITION: discharge to home, with antibiotics and pain medicine. Discussed care with the patient and his mother. Followup in  clinic will be scheduled in 10-14 days.

## 2014-01-26 NOTE — Anesthesia Postprocedure Evaluation (Signed)
  Anesthesia Post-op Note  Patient: Gabriel Adams  Procedure(s) Performed: Procedure(s): LUMBAR SPINAL CORD STIMULATOR INSERTION (N/A)  Patient Location: PACU  Anesthesia Type:MAC  Level of Consciousness: awake and alert   Airway and Oxygen Therapy: Patient Spontanous Breathing and Patient connected to nasal cannula oxygen  Post-op Pain: mild  Post-op Assessment: Post-op Vital signs reviewed, Patient's Cardiovascular Status Stable, Respiratory Function Stable, Patent Airway and No signs of Nausea or vomiting  Post-op Vital Signs: Reviewed and stable  Last Vitals:  Filed Vitals:   01/26/14 1415  BP:   Pulse:   Temp: 36.7 C  Resp:     Complications: No apparent anesthesia complications

## 2014-01-26 NOTE — Transfer of Care (Signed)
Immediate Anesthesia Transfer of Care Note  Patient: Gabriel Adams  Procedure(s) Performed: Procedure(s): LUMBAR SPINAL CORD STIMULATOR INSERTION (N/A)  Patient Location: PACU  Anesthesia Type:MAC  Level of Consciousness: awake, alert  and oriented  Airway & Oxygen Therapy: Patient Spontanous Breathing and Patient connected to nasal cannula oxygen  Post-op Assessment: Report given to PACU RN, Post -op Vital signs reviewed and stable and Patient moving all extremities X 4  Post vital signs: Reviewed and stable  Complications: No apparent anesthesia complications

## 2014-01-29 ENCOUNTER — Encounter (HOSPITAL_COMMUNITY): Payer: Self-pay | Admitting: Anesthesiology

## 2014-04-26 ENCOUNTER — Other Ambulatory Visit: Payer: Self-pay | Admitting: Ophthalmology

## 2014-04-26 DIAGNOSIS — H02846 Edema of left eye, unspecified eyelid: Secondary | ICD-10-CM

## 2014-04-30 ENCOUNTER — Ambulatory Visit
Admission: RE | Admit: 2014-04-30 | Discharge: 2014-04-30 | Disposition: A | Discharge status: Home / Self Care | Payer: Medicare Other | Source: Ambulatory Visit | Attending: Ophthalmology | Admitting: Ophthalmology

## 2014-04-30 DIAGNOSIS — H02846 Edema of left eye, unspecified eyelid: Secondary | ICD-10-CM

## 2014-04-30 MED ORDER — IOPAMIDOL (ISOVUE-300) INJECTION 61%
75.0000 mL | Freq: Once | INTRAVENOUS | Status: AC | PRN
  Administered 2014-04-30: 75 mL via INTRAVENOUS

## 2015-11-13 ENCOUNTER — Other Ambulatory Visit: Payer: Self-pay | Admitting: Neurosurgery

## 2015-11-13 DIAGNOSIS — M5412 Radiculopathy, cervical region: Secondary | ICD-10-CM

## 2015-11-22 ENCOUNTER — Ambulatory Visit
Admission: RE | Admit: 2015-11-22 | Discharge: 2015-11-22 | Disposition: A | Discharge status: Home / Self Care | Payer: Medicare Other | Source: Ambulatory Visit | Attending: Neurosurgery | Admitting: Neurosurgery

## 2015-11-22 DIAGNOSIS — M5412 Radiculopathy, cervical region: Secondary | ICD-10-CM

## 2015-11-22 MED ORDER — ONDANSETRON HCL 4 MG/2ML IJ SOLN
4.0000 mg | Freq: Once | INTRAMUSCULAR | Status: AC
  Administered 2015-11-22: 4 mg via INTRAMUSCULAR

## 2015-11-22 MED ORDER — MEPERIDINE HCL 100 MG/ML IJ SOLN
75.0000 mg | Freq: Once | INTRAMUSCULAR | Status: AC
  Administered 2015-11-22: 75 mg via INTRAMUSCULAR

## 2015-11-22 MED ORDER — DIAZEPAM 5 MG PO TABS
10.0000 mg | ORAL_TABLET | Freq: Once | ORAL | Status: AC
  Administered 2015-11-22: 10 mg via ORAL

## 2015-11-22 MED ORDER — IOPAMIDOL (ISOVUE-M 300) INJECTION 61%
10.0000 mL | Freq: Once | INTRAMUSCULAR | Status: AC | PRN
  Administered 2015-11-22: 10 mL via INTRATHECAL

## 2015-11-22 NOTE — Discharge Instructions (Signed)
Myelogram Discharge Instructions  1. Go home and rest quietly for the next 24 hours.  It is important to lie flat for the next 24 hours.  Get up only to go to the restroom.  You may lie in the bed or on a couch on your back, your stomach, your left side or your right side.  You may have one pillow under your head.  You may have pillows between your knees while you are on your side or under your knees while you are on your back.  2. DO NOT drive today.  Recline the seat as far back as it will go, while still wearing your seat belt, on the way home.  3. You may get up to go to the bathroom as needed.  You may sit up for 10 minutes to eat.  You may resume your normal diet and medications unless otherwise indicated.  Drink lots of extra fluids today and tomorrow.  4. The incidence of headache, nausea, or vomiting is about 5% (one in 20 patients).  If you develop a headache, lie flat and drink plenty of fluids until the headache goes away.  Caffeinated beverages may be helpful.  If you develop severe nausea and vomiting or a headache that does not go away with flat bed rest, call [phone].  5. You may resume normal activities after your 24 hours of bed rest is over; however, do not exert yourself strongly or do any heavy lifting tomorrow. If when you get up you have a headache when standing, go back to bed and force fluids for another 24 hours.  6. Call your physician for a follow-up appointment.  The results of your myelogram will be sent directly to your physician by the following day.  7. If you have any questions or if complications develop after you arrive home, please call [phone].  Discharge instructions have been explained to the patient.  The patient, or the person responsible for the patient, fully understands these instructions.        May resume Cymbalta on Oct. 7, 2017, after 1:00 pm.

## 2015-11-26 ENCOUNTER — Telehealth: Payer: Self-pay

## 2015-11-26 NOTE — Telephone Encounter (Signed)
Called patient to see how he is feeling after his myelogram here 11/22/15.  He confesses that he didn't follow our strict bedrest instructions, going out instead to "piddle around."  By the next afternoon he had a slight headache with significant nausea and vomiting.  He states he stayed in bed from Saturday night until Monday morning and is "all better now."  He thanks [place] all for being so nice and good to him.   jkl

## 2018-11-17 DEATH — deceased

## 2019-11-17 DEATH — deceased

## 2020-11-16 DEATH — deceased
